# Patient Record
Sex: Female | Born: 1955 | Race: White | Hispanic: No | Smoking: Former smoker
Health system: Southern US, Community
[De-identification: ages and names within clinical notes are randomized; demographics above are authoritative.]

## PROBLEM LIST (undated history)

## (undated) DIAGNOSIS — J449 Chronic obstructive pulmonary disease, unspecified: Secondary | ICD-10-CM

## (undated) DIAGNOSIS — J45909 Unspecified asthma, uncomplicated: Secondary | ICD-10-CM

## (undated) DIAGNOSIS — E119 Type 2 diabetes mellitus without complications: Secondary | ICD-10-CM

## (undated) DIAGNOSIS — G4730 Sleep apnea, unspecified: Secondary | ICD-10-CM

## (undated) HISTORY — DX: Unspecified asthma, uncomplicated: J45.909

## (undated) HISTORY — DX: Chronic obstructive pulmonary disease, unspecified: J44.9

## (undated) HISTORY — DX: Type 2 diabetes mellitus without complications: E11.9

## (undated) HISTORY — PX: TUBAL LIGATION: SHX77

## (undated) HISTORY — PX: BACK SURGERY: SHX140

## (undated) HISTORY — PX: TONSILLECTOMY AND ADENOIDECTOMY: SHX28

## (undated) HISTORY — PX: CYSTOSTOMY W/ BLADDER BIOPSY: SHX1431

## (undated) HISTORY — PX: DILATION AND EVACUATION: SHX1459

## (undated) HISTORY — DX: Sleep apnea, unspecified: G47.30

---

## 2007-03-05 ENCOUNTER — Encounter: Admission: RE | Admit: 2007-03-05 | Discharge: 2007-03-05 | Payer: Self-pay | Admitting: Obstetrics & Gynecology

## 2008-03-17 ENCOUNTER — Encounter: Admission: RE | Admit: 2008-03-17 | Discharge: 2008-03-17 | Payer: Self-pay | Admitting: Obstetrics & Gynecology

## 2009-03-30 ENCOUNTER — Encounter: Admission: RE | Admit: 2009-03-30 | Discharge: 2009-03-30 | Payer: Self-pay | Admitting: Obstetrics & Gynecology

## 2010-04-26 ENCOUNTER — Encounter: Admission: RE | Admit: 2010-04-26 | Payer: Self-pay | Admitting: Obstetrics & Gynecology

## 2010-05-10 ENCOUNTER — Encounter: Admission: RE | Admit: 2010-05-10 | Discharge: 2010-05-10 | Payer: Self-pay | Admitting: Obstetrics & Gynecology

## 2010-08-01 ENCOUNTER — Encounter: Payer: Self-pay | Admitting: Obstetrics & Gynecology

## 2011-05-25 ENCOUNTER — Other Ambulatory Visit: Payer: Self-pay | Admitting: Obstetrics & Gynecology

## 2011-05-25 DIAGNOSIS — Z1231 Encounter for screening mammogram for malignant neoplasm of breast: Secondary | ICD-10-CM

## 2011-06-06 ENCOUNTER — Ambulatory Visit
Admission: RE | Admit: 2011-06-06 | Discharge: 2011-06-06 | Disposition: A | Discharge status: Home / Self Care | Payer: Self-pay | Source: Ambulatory Visit | Attending: Obstetrics & Gynecology | Admitting: Obstetrics & Gynecology

## 2011-06-06 DIAGNOSIS — Z1231 Encounter for screening mammogram for malignant neoplasm of breast: Secondary | ICD-10-CM

## 2012-06-03 ENCOUNTER — Other Ambulatory Visit: Payer: Self-pay | Admitting: Obstetrics & Gynecology

## 2012-06-03 ENCOUNTER — Ambulatory Visit: Payer: Federal, State, Local not specified - PPO

## 2012-06-03 DIAGNOSIS — Z1231 Encounter for screening mammogram for malignant neoplasm of breast: Secondary | ICD-10-CM

## 2012-06-11 ENCOUNTER — Ambulatory Visit: Payer: Federal, State, Local not specified - PPO

## 2012-06-18 ENCOUNTER — Ambulatory Visit (INDEPENDENT_AMBULATORY_CARE_PROVIDER_SITE_OTHER): Payer: Federal, State, Local not specified - PPO

## 2012-06-18 DIAGNOSIS — Z1231 Encounter for screening mammogram for malignant neoplasm of breast: Secondary | ICD-10-CM

## 2013-06-09 ENCOUNTER — Other Ambulatory Visit: Payer: Self-pay | Admitting: Obstetrics & Gynecology

## 2013-06-09 DIAGNOSIS — Z1231 Encounter for screening mammogram for malignant neoplasm of breast: Secondary | ICD-10-CM

## 2013-06-12 ENCOUNTER — Ambulatory Visit: Payer: Federal, State, Local not specified - PPO

## 2013-06-19 ENCOUNTER — Ambulatory Visit: Payer: Federal, State, Local not specified - PPO

## 2013-07-01 ENCOUNTER — Ambulatory Visit (INDEPENDENT_AMBULATORY_CARE_PROVIDER_SITE_OTHER): Payer: Federal, State, Local not specified - PPO

## 2013-07-01 DIAGNOSIS — Z1231 Encounter for screening mammogram for malignant neoplasm of breast: Secondary | ICD-10-CM

## 2014-07-01 ENCOUNTER — Other Ambulatory Visit: Payer: Self-pay | Admitting: Obstetrics & Gynecology

## 2014-07-01 DIAGNOSIS — Z1231 Encounter for screening mammogram for malignant neoplasm of breast: Secondary | ICD-10-CM

## 2014-07-08 ENCOUNTER — Ambulatory Visit (INDEPENDENT_AMBULATORY_CARE_PROVIDER_SITE_OTHER): Payer: Federal, State, Local not specified - PPO

## 2014-07-08 DIAGNOSIS — Z1231 Encounter for screening mammogram for malignant neoplasm of breast: Secondary | ICD-10-CM

## 2015-06-14 ENCOUNTER — Other Ambulatory Visit: Payer: Self-pay | Admitting: Obstetrics & Gynecology

## 2015-06-14 DIAGNOSIS — Z139 Encounter for screening, unspecified: Secondary | ICD-10-CM

## 2015-07-14 ENCOUNTER — Ambulatory Visit (INDEPENDENT_AMBULATORY_CARE_PROVIDER_SITE_OTHER): Payer: Federal, State, Local not specified - PPO

## 2015-07-14 DIAGNOSIS — Z1231 Encounter for screening mammogram for malignant neoplasm of breast: Secondary | ICD-10-CM | POA: Diagnosis not present

## 2015-07-14 DIAGNOSIS — Z139 Encounter for screening, unspecified: Secondary | ICD-10-CM

## 2017-01-19 ENCOUNTER — Other Ambulatory Visit: Payer: Self-pay | Admitting: Obstetrics & Gynecology

## 2017-01-19 DIAGNOSIS — Z1231 Encounter for screening mammogram for malignant neoplasm of breast: Secondary | ICD-10-CM

## 2017-01-30 ENCOUNTER — Ambulatory Visit (INDEPENDENT_AMBULATORY_CARE_PROVIDER_SITE_OTHER): Payer: Federal, State, Local not specified - PPO

## 2017-01-30 DIAGNOSIS — Z1231 Encounter for screening mammogram for malignant neoplasm of breast: Secondary | ICD-10-CM | POA: Diagnosis not present

## 2017-02-15 ENCOUNTER — Encounter: Payer: Self-pay | Admitting: Obstetrics & Gynecology

## 2017-04-19 ENCOUNTER — Encounter: Payer: Self-pay | Admitting: Obstetrics & Gynecology

## 2017-04-19 ENCOUNTER — Ambulatory Visit (INDEPENDENT_AMBULATORY_CARE_PROVIDER_SITE_OTHER): Payer: Federal, State, Local not specified - PPO | Admitting: Obstetrics & Gynecology

## 2017-04-19 VITALS — BP 132/86 | Ht 67.0 in | Wt 212.0 lb

## 2017-04-19 DIAGNOSIS — Z01419 Encounter for gynecological examination (general) (routine) without abnormal findings: Secondary | ICD-10-CM

## 2017-04-19 DIAGNOSIS — Z780 Asymptomatic menopausal state: Secondary | ICD-10-CM

## 2017-04-19 DIAGNOSIS — Z720 Tobacco use: Secondary | ICD-10-CM | POA: Diagnosis not present

## 2017-04-19 DIAGNOSIS — Z23 Encounter for immunization: Secondary | ICD-10-CM

## 2017-04-19 NOTE — Addendum Note (Signed)
Addended by: Berna Spare A on: 04/19/2017 12:33 PM   Modules accepted: Orders

## 2017-04-19 NOTE — Addendum Note (Signed)
Addended by: Berna Spare A on: 04/19/2017 12:35 PM   Modules accepted: Orders

## 2017-04-19 NOTE — Patient Instructions (Signed)
1. Encounter for routine gynecological examination with Papanicolaou smear of cervix Normal gyn exam.  Pap reflex done.  Breasts wnl.  Mammo 01/2017 neg.  2. Menopause present No HRT.  No PMB.  Vit D supplement/Ca++ in nutrition/Weight bearing physical activity.  Schedule Bone Density here asap. - DG Bone Density; Future  3. Tobacco abuse Strongly recommend smoking cessation.  CXR with Fam MD. - DG Bone Density; Future  Marissa White, it was a pleasure to see you today!  I will inform you of your results as soon as available.   How to Increase Your Level of Physical Activity Getting regular physical activity is important for your overall health and well-being. Most people do not get enough exercise. There are easy ways to increase your level of physical activity, even if you have not been very active in the past or you are just starting out. Why is physical activity important? Physical activity has many short-term and long-term health benefits. Regular exercise can:  Help you lose weight or maintain a healthy weight.  Strengthen your muscles and bones.  Boost your mood and improve self-esteem.  Reduce your risk of certain long-term (chronic) diseases, like heart disease, cancer, and diabetes.  Help you stay capable of walking and moving around (mobile) as you age.  Prevent accidents, such as falls, as you age.  Increase life expectancy.  What are the benefits of being physically active on a regular basis? In addition to improving your physical health, being physically active on most days of the week can help you in ways that you may not expect. Benefits of regular physical activity may include:  Feeling good about your body.  Being able to move around more easily and for longer periods of time without getting tired (increased stamina).  Finding new sources of fun and enjoyment.  Meeting new people who share a common interest.  Being able to fight off illness better (enhanced  immunity).  Being able to sleep better.  What can happen if I am not physically active on a regular basis? Not getting enough physical activity can lead to an unhealthy lifestyle and future health problems. This can increase your chances of:  Becoming overweight or obese.  Becoming sick.  Developing chronic illnesses, like heart disease or diabetes.  Having mental health problems, like depression or anxiety.  Having sleep problems.  Having trouble walking or getting yourself around (reduced mobility).  Injuring yourself in a fall as you get older.  What steps can I take to be more physically active?  Check with your health care provider about how to get started. Ask your health care provider what activities are safe for you.  Start out slowly. Walking or doing some simple chair exercises is a good place to start, especially if you have not been active before or for a long time.  Try to find activities that you enjoy. You are more likely to commit to an exercise routine if it does not feel like a chore.  If you have bone or joint problems, choose low-impact exercises, like walking or swimming.  Include physical activity in your everyday routine.  Invite friends or family members to exercise with you. This also will help you commit to your workout plan.  Set goals that you can work toward.  Aim for at least 150 minutes of moderate-intensity exercise each week. Examples of moderate-intensity exercise include walking or riding a bike. Where to find more information:  Centers for Disease Control and Prevention: JokeRule.co.uk  [employer] on [employer] www.http://smith-thompson.com/  ChooseMyPlate: MissedFlights.com.br Contact a health care provider if:  You have headaches, muscle aches, or joint pain.  You feel dizzy or light-headed while exercising.  You faint.  You have chest pain while  exercising. Summary  Exercise benefits your mind and body at any age, even if you are just starting out.  If you have a chronic illness or have not been active for a while, check with your health care provider before increasing your physical activity.  Choose activities that are safe and enjoyable for you.Ask your health care provider what activities are safe for you.  Start slowly. Tell your health care provider if you have problems as you start to increase your activity level. This information is not intended to replace advice given to you by your health care provider. Make sure you discuss any questions you have with your health care provider. Document Released: 06/15/2016 Document Revised: 06/15/2016 Document Reviewed: 06/15/2016 Elsevier Interactive Patient Education  2018 [employer].  Health Risks of Smoking Smoking cigarettes is very bad for your health. Tobacco smoke has over 200 known poisons in it. It contains the poisonous gases nitrogen oxide and carbon monoxide. There are over 60 chemicals in tobacco smoke that cause cancer. Smoking is difficult to quit because a chemical in tobacco, called nicotine, causes addiction or dependence. When you smoke and inhale, nicotine is absorbed rapidly into the bloodstream through your lungs. Both inhaled and non-inhaled nicotine may be addictive. What are the risks of cigarette smoke? Cigarette smokers have an increased risk of many serious medical problems, including:  Lung cancer.  Lung disease, such as pneumonia, bronchitis, and emphysema.  Chest pain (angina) and heart attack because the heart is not getting enough oxygen.  Heart disease and peripheral blood vessel disease.  High blood pressure (hypertension).  Stroke.  Oral cancer, including cancer of the lip, mouth, or voice box.  Bladder cancer.  Pancreatic cancer.  Cervical cancer.  Pregnancy complications, including premature birth.  Stillbirths and smaller  newborn babies, birth defects, and genetic damage to sperm.  Early menopause.  Lower estrogen level for women.  Infertility.  Facial wrinkles.  Blindness.  Increased risk of broken bones (fractures).  Senile dementia.  Stomach ulcers and internal bleeding.  Delayed wound healing and increased risk of complications during surgery.  Even smoking lightly shortens your life expectancy by several years.  Because of secondhand smoke exposure, children of smokers have an increased risk of the following:  Sudden infant death syndrome (SIDS).  Respiratory infections.  Lung cancer.  Heart disease.  Ear infections.  What are the benefits of quitting? There are many health benefits of quitting smoking. Here are some of them:  Within days of quitting smoking, your risk of having a heart attack decreases, your blood flow improves, and your lung capacity improves. Blood pressure, pulse rate, and breathing patterns start returning to normal soon after quitting.  Within months, your lungs may clear up completely.  Quitting for 10 years reduces your risk of developing lung cancer and heart disease to almost that of a nonsmoker.  People who quit may see an improvement in their overall quality of life.  How do I quit smoking? Smoking is an addiction with both physical and psychological effects, and longtime habits can be hard to change. Your health care provider can recommend:  Programs and community resources, which may include group support, education, or talk therapy.  Prescription medicines to help reduce cravings.  Nicotine replacement products, such as patches, gum, and nasal sprays. Use these products only as directed. Do not replace cigarette smoking with electronic cigarettes, which are commonly called e-cigarettes. The safety of e-cigarettes is not known, and some may contain harmful chemicals.  A combination of two or more of these methods.  Where to find more  information:  American Lung Association: www.lung.org  American Cancer Society: www.cancer.org Summary  Smoking cigarettes is very bad for your health. Cigarette smokers have an increased risk of many serious medical problems, including several cancers, heart disease, and stroke.  Smoking is an addiction with both physical and psychological effects, and longtime habits can be hard to change.  By stopping right away, you can greatly reduce the risk of medical problems for you and your family.  To help you quit smoking, your health care provider can recommend programs, community resources, prescription medicines, and nicotine replacement products such as patches, gum, and nasal sprays. This information is not intended to replace advice given to you by your health care provider. Make sure you discuss any questions you have with your health care provider. Document Released: 08/03/2004 Document Revised: 06/30/2016 Document Reviewed: 06/30/2016 Elsevier Interactive Patient Education  2017 [employer].

## 2017-04-19 NOTE — Progress Notes (Signed)
Marissa White 05-21-1956 [id number]   History:    61 y.o. G5P1A4L1 Boyfriend  RP:  Established patient presenting for annual gyn exam   HPI:  Menopause.  No HRT.  No PMB.  No pelvic pain.  Normal vaginal secretions.  Rarely sexually active, using condoms each time.  Breasts wnl.  Occasionally relapses with Cigarette smoking.  Strongly recommend cessation of smoking.  Recommend CXR with Fam MD.  Mictions/BMs wnl.  Past medical history,surgical history, family history and social history were all reviewed and documented in the EPIC chart.  Gynecologic History No LMP recorded. Patient is postmenopausal. Contraception: post menopausal status Last Pap: 01/2016. Results were: Neg/HPV HR neg Last mammogram: 01/30/2017. Results were: Negative [facility] 2014, 5 yr schedule No recent Bone Density, will schedule here  Obstetric History OB History  Gravida Para Term Preterm AB Living  SAB TAB Ectopic Multiple Live Births  3            # Outcome Date GA Lbr Len/2nd Weight Sex Delivery Anes PTL Lv  5 Gravida           4 SAB           3 SAB           2 SAB           1 Para                ROS: A ROS was performed and pertinent positives and negatives are included in the history.  GENERAL: No fevers or chills. HEENT: No change in vision, no earache, sore throat or sinus congestion. NECK: No pain or stiffness. CARDIOVASCULAR: No chest pain or pressure. No palpitations. PULMONARY: No shortness of breath, cough or wheeze. GASTROINTESTINAL: No abdominal pain, nausea, vomiting or diarrhea, melena or bright red blood per rectum. GENITOURINARY: No urinary frequency, urgency, hesitancy or dysuria. MUSCULOSKELETAL: No joint or muscle pain, no back pain, no recent trauma. DERMATOLOGIC: No rash, no itching, no lesions. ENDOCRINE: No polyuria, polydipsia, no heat or cold intolerance. No recent change in weight. HEMATOLOGICAL: No anemia or easy bruising or bleeding. NEUROLOGIC: No headache,  seizures, numbness, tingling or weakness. PSYCHIATRIC: No depression, no loss of interest in normal activity or change in sleep pattern.     Exam:   BP 132/86   Ht  (1.702 m)   Wt 212 lb (96.2 kg)   BMI 33.20 kg/m   Body mass index is 33.2 kg/m.  General appearance : Well developed well nourished female. No acute distress HEENT: Eyes: no retinal hemorrhage or exudates,  Neck supple, trachea midline, no carotid bruits, no thyroidmegaly Lungs: Clear to auscultation, no rhonchi or wheezes, or rib retractions  Heart: Regular rate and rhythm, no murmurs or gallops Breast:Examined in sitting and supine position were symmetrical in appearance, no palpable masses or tenderness,  no skin retraction, no nipple inversion, no nipple discharge, no skin discoloration, no axillary or supraclavicular lymphadenopathy Abdomen: no palpable masses or tenderness, no rebound or guarding Extremities: no edema or skin discoloration or tenderness  Pelvic: Vulva normal  Bartholin, Urethra, Skene Glands: Within normal limits             Vagina: No gross lesions or discharge  Cervix: No gross lesions or discharge.  Pap reflex done.  Uterus  AV, normal size, shape and consistency, non-tender and mobile  Adnexa  Without masses or tenderness  Anus and perineum  normal  Assessment/Plan:  61 y.o. female for annual exam   1. Encounter for routine gynecological examination with Papanicolaou smear of cervix Normal gyn exam.  Pap reflex done.  Breasts wnl.  Mammo 01/2017 neg.  2. Menopause present No HRT.  No PMB.  Vit D supplement/Ca++ in nutrition/Weight bearing physical activity.  Schedule Bone Density here asap. - DG Bone Density; Future  3. Tobacco abuse Strongly recommend smoking cessation.  CXR with Fam MD. - DG Bone Density; Future  Genia Del MD, 11:38 AM 04/19/2017

## 2017-04-23 LAB — PAP IG W/ RFLX HPV ASCU

## 2017-05-03 ENCOUNTER — Other Ambulatory Visit: Payer: Self-pay | Admitting: Gynecology

## 2017-05-03 DIAGNOSIS — Z13820 Encounter for screening for osteoporosis: Secondary | ICD-10-CM

## 2017-05-03 DIAGNOSIS — Z780 Asymptomatic menopausal state: Secondary | ICD-10-CM

## 2017-05-15 ENCOUNTER — Encounter (INDEPENDENT_AMBULATORY_CARE_PROVIDER_SITE_OTHER): Payer: Self-pay

## 2017-05-15 ENCOUNTER — Ambulatory Visit (INDEPENDENT_AMBULATORY_CARE_PROVIDER_SITE_OTHER): Payer: Federal, State, Local not specified - PPO

## 2017-05-15 ENCOUNTER — Other Ambulatory Visit: Payer: Self-pay | Admitting: Gynecology

## 2017-05-15 DIAGNOSIS — Z13820 Encounter for screening for osteoporosis: Secondary | ICD-10-CM

## 2017-05-15 DIAGNOSIS — M85852 Other specified disorders of bone density and structure, left thigh: Secondary | ICD-10-CM

## 2017-05-15 DIAGNOSIS — M8588 Other specified disorders of bone density and structure, other site: Secondary | ICD-10-CM | POA: Diagnosis not present

## 2017-05-15 DIAGNOSIS — Z780 Asymptomatic menopausal state: Secondary | ICD-10-CM | POA: Diagnosis not present

## 2017-05-16 ENCOUNTER — Encounter: Payer: Self-pay | Admitting: Gynecology

## 2018-04-10 ENCOUNTER — Other Ambulatory Visit: Payer: Self-pay | Admitting: Obstetrics & Gynecology

## 2018-04-10 DIAGNOSIS — Z1231 Encounter for screening mammogram for malignant neoplasm of breast: Secondary | ICD-10-CM

## 2018-04-17 ENCOUNTER — Ambulatory Visit: Payer: Federal, State, Local not specified - PPO

## 2018-07-11 ENCOUNTER — Encounter: Payer: Self-pay | Admitting: Obstetrics & Gynecology

## 2018-07-11 ENCOUNTER — Ambulatory Visit (INDEPENDENT_AMBULATORY_CARE_PROVIDER_SITE_OTHER): Payer: Federal, State, Local not specified - PPO | Admitting: Obstetrics & Gynecology

## 2018-07-11 VITALS — BP 110/80 | Ht 67.0 in | Wt 215.0 lb

## 2018-07-11 DIAGNOSIS — Z780 Asymptomatic menopausal state: Secondary | ICD-10-CM | POA: Diagnosis not present

## 2018-07-11 DIAGNOSIS — Z6833 Body mass index (BMI) 33.0-33.9, adult: Secondary | ICD-10-CM

## 2018-07-11 DIAGNOSIS — Z01419 Encounter for gynecological examination (general) (routine) without abnormal findings: Secondary | ICD-10-CM

## 2018-07-11 DIAGNOSIS — E6609 Other obesity due to excess calories: Secondary | ICD-10-CM

## 2018-07-11 NOTE — Patient Instructions (Signed)
1. Encounter for routine gynecological examination with Papanicolaou smear of cervix Normal gynecologic exam in menopause.  Pap reflex done.  Breast exam normal.  Will schedule screening mammogram at the breast center now.  Health labs with family physician.  Colonoscopy done in 2019.  2. Postmenopausal Well on no hormone replacement therapy.  No postmenopausal bleeding.  Recommend vitamin D supplements, calcium intake of 1.5 g/day including nutritional and supplemental.  Regular weightbearing physical activity recommended.  Last bone density November 2018 showed osteopenia with a T score of -1.3.  We will repeat a bone density in November 2020.  3. Class 1 obesity due to excess calories without serious comorbidity with body mass index (BMI) of 33.0 to 33.9 in adult Recommend a lower calorie/carb diet such as [employer].  Aerobic physical activities recommended 5 times a week and weightlifting every 2 days.  [place], it was a pleasure seeing you today!  I will inform you of your results as soon as they are available.

## 2018-07-11 NOTE — Progress Notes (Signed)
Marissa White 11-15-1955 [id number]   History:    63 y.o. G5P1A4L1 Single.  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  No PMB.  No Pelvic pain.  Has been sexually active since last year's Pap test but no current sexual activity.  Normal vaginal secretions.  Urine and bowel movements normal currently.  Breasts normal.  Body mass index 33.67.  Intends to exercise more.  We will also start a lower calorie diet to lose weight.  Health labs with family physician.  Colonoscopy in 2019.  Past medical history,surgical history, family history and social history were all reviewed and documented in the EPIC chart.  Gynecologic History No LMP recorded. Patient is postmenopausal. Contraception: condoms/Postmenopausal Last Pap: 04/2017.  Results were: Negative Last mammogram: 01/2017. Results were: Negative Bone Density: 05/2017 Osteopenia T-Score -1.3 Colonoscopy: 2019  Obstetric History OB History  Gravida Para Term Preterm AB Living  5 1     3 1   SAB TAB Ectopic Multiple Live Births  3            # Outcome Date GA Lbr Len/2nd Weight Sex Delivery Anes PTL Lv  5 Gravida           4 SAB           3 SAB           2 SAB           1 Para              ROS: A ROS was performed and pertinent positives and negatives are included in the history.  GENERAL: No fevers or chills. HEENT: No change in vision, no earache, sore throat or sinus congestion. NECK: No pain or stiffness. CARDIOVASCULAR: No chest pain or pressure. No palpitations. PULMONARY: No shortness of breath, cough or wheeze. GASTROINTESTINAL: No abdominal pain, nausea, vomiting or diarrhea, melena or bright red blood per rectum. GENITOURINARY: No urinary frequency, urgency, hesitancy or dysuria. MUSCULOSKELETAL: No joint or muscle pain, no back pain, no recent trauma. DERMATOLOGIC: No rash, no itching, no lesions. ENDOCRINE: No polyuria, polydipsia, no heat  or cold intolerance. No recent change in weight. HEMATOLOGICAL: No anemia or easy bruising or bleeding. NEUROLOGIC: No headache, seizures, numbness, tingling or weakness. PSYCHIATRIC: No depression, no loss of interest in normal activity or change in sleep pattern.     Exam:   BP 110/80 (BP Location: Right Arm, Patient Position: Sitting, Cuff Size: Large)   Ht 5\' 7"  (1.702 m)   Wt 215 lb (97.5 kg)   BMI 33.67 kg/m   Body mass index is 33.67 kg/m.  General appearance : Well developed well nourished female. No acute distress HEENT: Eyes: no retinal hemorrhage or exudates,  Neck supple, trachea midline, no carotid bruits, no thyroidmegaly Lungs: Clear to auscultation, no rhonchi or wheezes, or rib retractions  Heart: Regular rate and rhythm, no murmurs or gallops Breast:Examined in sitting and supine position were symmetrical in appearance, no palpable masses or tenderness,  no skin retraction, no nipple inversion, no nipple discharge, no skin discoloration, no axillary or supraclavicular lymphadenopathy Abdomen: no palpable masses or tenderness, no rebound or guarding Extremities: no edema or skin discoloration or tenderness  Pelvic: Vulva: Normal             Vagina: No gross lesions or discharge  Cervix: No gross lesions or discharge.  Pap reflex done  Uterus  AV, normal size, shape and consistency, non-tender and mobile  Adnexa  Without masses or tenderness  Anus: Normal   Assessment/Plan:  63 y.o. female for annual exam   1. Encounter for routine gynecological examination with Papanicolaou smear of cervix Normal gynecologic exam in menopause.  Pap reflex done.  Breast exam normal.  Will schedule screening mammogram at the breast center now.  Health labs with family physician.  Colonoscopy done in 2019.  2. Postmenopausal Well on no hormone replacement therapy.  No postmenopausal bleeding.  Recommend vitamin D supplements, calcium intake of 1.5 g/day including nutritional and  supplemental.  Regular weightbearing physical activity recommended.  Last bone density November 2018 showed osteopenia with a T score of -1.3.  We will repeat a bone density in November 2020.  3. Class 1 obesity due to excess calories without serious comorbidity with body mass index (BMI) of 33.0 to 33.9 in adult Recommend a lower calorie/carb diet such as [employer].  Aerobic physical activities recommended 5 times a week and weightlifting every 2 days.  Genia Del MD, 9:08 AM 07/11/2018

## 2018-07-12 LAB — PAP IG W/ RFLX HPV ASCU

## 2018-08-01 ENCOUNTER — Ambulatory Visit (INDEPENDENT_AMBULATORY_CARE_PROVIDER_SITE_OTHER): Payer: Federal, State, Local not specified - PPO

## 2018-08-01 DIAGNOSIS — Z1231 Encounter for screening mammogram for malignant neoplasm of breast: Secondary | ICD-10-CM

## 2019-04-08 ENCOUNTER — Encounter: Payer: Self-pay | Admitting: Gynecology

## 2019-07-17 ENCOUNTER — Encounter: Payer: Federal, State, Local not specified - PPO | Admitting: Obstetrics & Gynecology

## 2019-07-24 ENCOUNTER — Encounter: Payer: Federal, State, Local not specified - PPO | Admitting: Obstetrics & Gynecology

## 2019-10-07 ENCOUNTER — Other Ambulatory Visit: Payer: Self-pay

## 2019-10-08 ENCOUNTER — Encounter: Payer: Self-pay | Admitting: Obstetrics & Gynecology

## 2019-10-08 ENCOUNTER — Ambulatory Visit (INDEPENDENT_AMBULATORY_CARE_PROVIDER_SITE_OTHER): Payer: Federal, State, Local not specified - PPO | Admitting: Obstetrics & Gynecology

## 2019-10-08 ENCOUNTER — Other Ambulatory Visit: Payer: Self-pay | Admitting: Obstetrics & Gynecology

## 2019-10-08 VITALS — BP 136/88 | Ht 66.0 in | Wt 197.0 lb

## 2019-10-08 DIAGNOSIS — E6609 Other obesity due to excess calories: Secondary | ICD-10-CM

## 2019-10-08 DIAGNOSIS — Z780 Asymptomatic menopausal state: Secondary | ICD-10-CM | POA: Diagnosis not present

## 2019-10-08 DIAGNOSIS — M85852 Other specified disorders of bone density and structure, left thigh: Secondary | ICD-10-CM

## 2019-10-08 DIAGNOSIS — M8588 Other specified disorders of bone density and structure, other site: Secondary | ICD-10-CM

## 2019-10-08 DIAGNOSIS — Z01419 Encounter for gynecological examination (general) (routine) without abnormal findings: Secondary | ICD-10-CM | POA: Diagnosis not present

## 2019-10-08 DIAGNOSIS — Z1231 Encounter for screening mammogram for malignant neoplasm of breast: Secondary | ICD-10-CM

## 2019-10-08 DIAGNOSIS — Z6831 Body mass index (BMI) 31.0-31.9, adult: Secondary | ICD-10-CM

## 2019-10-08 NOTE — Patient Instructions (Signed)
1. Well female exam with routine gynecological exam Normal gynecologic exam in menopause.  No indication to repeat a Pap test this year.  Breast exam normal.  Will schedule a screening mammogram now.  Colonoscopy 2019.  Health labs with family physician.  2. Postmenopausal Well on no hormone replacement therapy.  No postmenopausal bleeding.  3. Osteopenia of neck of left femur Osteopenia at the left femoral neck with a T score of -1.3 in November 2018.  We will repeat a bone density now.  Vitamin D supplements, calcium intake of 1200 mg daily and regular weightbearing physical activity is recommended. - DG Bone Density; Future  4. Class 1 obesity due to excess calories without serious comorbidity with body mass index (BMI) of 31.0 to 31.9 in adult Successful weight loss since last year.  We will continue on the lower calorie/carb diet and aerobic activities 5 times a week with light weightlifting every 2 days.  Marissa White, it was a pleasure seeing you today!

## 2019-10-08 NOTE — Progress Notes (Signed)
Marissa White August 08, 1955 [id number]   History:    64 y.o. [id number] Single.  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  No PMB.  No Pelvic pain.  Abstinent currently.  Normal vaginal secretions.  Urine and bowel movements normal currently.  Breasts normal.  Body mass index improved to 31.8. On a low Calorie/Carb diet.  Exercising more regularly.  Health labs with family physician.  Colonoscopy in 2019.   Past medical history,surgical history, family history and social history were all reviewed and documented in the EPIC chart.  Gynecologic History No LMP recorded. Patient is postmenopausal.  Obstetric History OB History  Gravida Para Term Preterm AB Living  5 1     3 1   SAB TAB Ectopic Multiple Live Births  3            # Outcome Date GA Lbr Len/2nd Weight Sex Delivery Anes PTL Lv  5 Gravida           4 SAB           3 SAB           2 SAB           1 Para              ROS: A ROS was performed and pertinent positives and negatives are included in the history.  GENERAL: No fevers or chills. HEENT: No change in vision, no earache, sore throat or sinus congestion. NECK: No pain or stiffness. CARDIOVASCULAR: No chest pain or pressure. No palpitations. PULMONARY: No shortness of breath, cough or wheeze. GASTROINTESTINAL: No abdominal pain, nausea, vomiting or diarrhea, melena or bright red blood per rectum. GENITOURINARY: No urinary frequency, urgency, hesitancy or dysuria. MUSCULOSKELETAL: No joint or muscle pain, no back pain, no recent trauma. DERMATOLOGIC: No rash, no itching, no lesions. ENDOCRINE: No polyuria, polydipsia, no heat or cold intolerance. No recent change in weight. HEMATOLOGICAL: No anemia or easy bruising or bleeding. NEUROLOGIC: No headache, seizures, numbness, tingling or weakness. PSYCHIATRIC: No depression, no loss of interest in normal  activity or change in sleep pattern.     Exam:   BP 136/88   Ht 5\' 6"  (1.676 m)   Wt 197 lb (89.4 kg)   BMI 31.80 kg/m   Body mass index is 31.8 kg/m.  General appearance : Well developed well nourished female. No acute distress HEENT: Eyes: no retinal hemorrhage or exudates,  Neck supple, trachea midline, no carotid bruits, no thyroidmegaly Lungs: Clear to auscultation, no rhonchi or wheezes, or rib retractions  Heart: Regular rate and rhythm, no murmurs or gallops Breast:Examined in sitting and supine position were symmetrical in appearance, no palpable masses or tenderness,  no skin retraction, no nipple inversion, no nipple discharge, no skin discoloration, no axillary or supraclavicular lymphadenopathy Abdomen: no palpable masses or tenderness, no rebound or guarding Extremities: no edema or skin discoloration or tenderness  Pelvic: Vulva: Normal             Vagina: No gross lesions or discharge  Cervix: No gross lesions or discharge  Uterus  AV, normal size, shape and consistency, non-tender and mobile  Adnexa  Without masses or tenderness  Anus: Normal   Assessment/Plan:  64 y.o. female for annual exam   1. Well female exam with routine gynecological exam Normal gynecologic exam in menopause.  No  indication to repeat a Pap test this year.  Breast exam normal.  Will schedule a screening mammogram now.  Colonoscopy 2019.  Health labs with family physician.  2. Postmenopausal Well on no hormone replacement therapy.  No postmenopausal bleeding.  3. Osteopenia of neck of left femur Osteopenia at the left femoral neck with a T score of -1.3 in November 2018.  We will repeat a bone density now.  Vitamin D supplements, calcium intake of 1200 mg daily and regular weightbearing physical activity is recommended. - DG Bone Density; Future  4. Class 1 obesity due to excess calories without serious comorbidity with body mass index (BMI) of 31.0 to 31.9 in adult Successful weight  loss since last year.  We will continue on the lower calorie/carb diet and aerobic activities 5 times a week with light weightlifting every 2 days.  Princess Bruins MD, 10:46 AM 10/08/2019

## 2019-10-22 ENCOUNTER — Ambulatory Visit (INDEPENDENT_AMBULATORY_CARE_PROVIDER_SITE_OTHER): Payer: Federal, State, Local not specified - PPO

## 2019-10-22 ENCOUNTER — Other Ambulatory Visit: Payer: Self-pay

## 2019-10-22 DIAGNOSIS — Z1231 Encounter for screening mammogram for malignant neoplasm of breast: Secondary | ICD-10-CM

## 2019-10-22 DIAGNOSIS — M85852 Other specified disorders of bone density and structure, left thigh: Secondary | ICD-10-CM | POA: Diagnosis not present

## 2019-11-19 NOTE — Telephone Encounter (Signed)
I spoke with patient and read her this unread My Chart email with bone density test result.

## 2020-10-08 ENCOUNTER — Encounter: Payer: Self-pay | Admitting: Obstetrics & Gynecology

## 2020-10-08 ENCOUNTER — Other Ambulatory Visit: Payer: Self-pay

## 2020-10-08 ENCOUNTER — Ambulatory Visit (INDEPENDENT_AMBULATORY_CARE_PROVIDER_SITE_OTHER): Payer: Federal, State, Local not specified - PPO | Admitting: Obstetrics & Gynecology

## 2020-10-08 VITALS — BP 136/84 | Ht 66.0 in | Wt 217.0 lb

## 2020-10-08 DIAGNOSIS — M85852 Other specified disorders of bone density and structure, left thigh: Secondary | ICD-10-CM

## 2020-10-08 DIAGNOSIS — Z780 Asymptomatic menopausal state: Secondary | ICD-10-CM | POA: Diagnosis not present

## 2020-10-08 DIAGNOSIS — Z6835 Body mass index (BMI) 35.0-35.9, adult: Secondary | ICD-10-CM

## 2020-10-08 DIAGNOSIS — Z01419 Encounter for gynecological examination (general) (routine) without abnormal findings: Secondary | ICD-10-CM | POA: Diagnosis not present

## 2020-10-08 DIAGNOSIS — E6609 Other obesity due to excess calories: Secondary | ICD-10-CM

## 2020-10-08 NOTE — Progress Notes (Signed)
Marissa White 1956-06-20 [id number]   History:    65 y.o. 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  Daughter Boneta Lucks (my patient as well)  EX:[id number] presenting for annual gyn exam   UYQ:[id number], well on no HRT. No PMB. No Pelvic pain. Abstinent currently. Normal vaginal secretions. Urine and bowel movements normal currently. Breasts normal. Body mass index increased to 35.02. Health labs with family physician. Colonoscopy in 2019.  Past medical history,surgical history, family history and social history were all reviewed and documented in the EPIC chart.  Gynecologic History No LMP recorded. Patient is postmenopausal.  Obstetric History OB History  Gravida Para Term Preterm AB Living  5 1     3 1   SAB IAB Ectopic Multiple Live Births  3            # Outcome Date GA Lbr Len/2nd Weight Sex Delivery Anes PTL Lv  5 Gravida           4 SAB           3 SAB           2 SAB           1 Para              ROS: A ROS was performed and pertinent positives and negatives are included in the history.  GENERAL: No fevers or chills. HEENT: No change in vision, no earache, sore throat or sinus congestion. NECK: No pain or stiffness. CARDIOVASCULAR: No chest pain or pressure. No palpitations. PULMONARY: No shortness of breath, cough or wheeze. GASTROINTESTINAL: No abdominal pain, nausea, vomiting or diarrhea, melena or bright red blood per rectum. GENITOURINARY: No urinary frequency, urgency, hesitancy or dysuria. MUSCULOSKELETAL: No joint or muscle pain, no back pain, no recent trauma. DERMATOLOGIC: No rash, no itching, no lesions. ENDOCRINE: No polyuria, polydipsia, no heat or cold intolerance. No recent change in weight. HEMATOLOGICAL: No anemia or easy bruising or bleeding. NEUROLOGIC: No headache, seizures, numbness, tingling or weakness. PSYCHIATRIC: No depression, no loss of interest in normal activity or change in sleep pattern.      Exam:   BP 136/84   Ht 5\' 6"  (1.676 m)   Wt 217 lb (98.4 kg)   BMI 35.02 kg/m   Body mass index is 35.02 kg/m.  General appearance : Well developed well nourished female. No acute distress HEENT: Eyes: no retinal hemorrhage or exudates,  Neck supple, trachea midline, no carotid bruits, no thyroidmegaly Lungs: Clear to auscultation, no rhonchi or wheezes, or rib retractions  Heart: Regular rate and rhythm, no murmurs or gallops Breast:Examined in sitting and supine position were symmetrical in appearance, no palpable masses or tenderness,  no skin retraction, no nipple inversion, no nipple discharge, no skin discoloration, no axillary or supraclavicular lymphadenopathy Abdomen: no palpable masses or tenderness, no rebound or guarding Extremities: no edema or skin discoloration or tenderness  Pelvic: Vulva: Normal             Vagina: No gross lesions or discharge  Cervix: No gross lesions or discharge  Uterus  AV, normal size, shape and consistency, non-tender and mobile  Adnexa  Without masses or tenderness  Anus: Normal   Assessment/Plan:  65 y.o. female for annual exam   1. Well female exam with routine gynecological exam Normal gynecologic exam in menopause.  No indication for Pap test at this time.  Breast exam normal.  Screening mammogram April 2021 was negative.  Colonoscopy 2019.  Health labs  with family physician.  2. Postmenopausal Well on no hormone replacement therapy.  No postmenopausal bleeding.  3. Osteopenia of neck of left femur Osteopenia at the spine with a T score of -1.5.  We will repeat a bone density in April 2023 at 4 years.  Recommend vitamin D supplements, calcium intake of 1.5 g/day total and regular weightbearing physical activities.  4. Class 2 obesity due to excess calories without serious comorbidity with body mass index (BMI) of 35.0 to 35.9 in adult Recommend a lower calorie/carb diet.  Aerobic activities 5 times a week and light  weightlifting every 2 days.  Genia Del MD, 3:22 PM 10/08/2020

## 2020-10-10 ENCOUNTER — Encounter: Payer: Self-pay | Admitting: Obstetrics & Gynecology

## 2021-02-15 ENCOUNTER — Other Ambulatory Visit: Payer: Self-pay | Admitting: Obstetrics & Gynecology

## 2021-02-15 DIAGNOSIS — Z1231 Encounter for screening mammogram for malignant neoplasm of breast: Secondary | ICD-10-CM

## 2021-02-24 ENCOUNTER — Ambulatory Visit: Payer: Federal, State, Local not specified - PPO

## 2021-03-03 ENCOUNTER — Ambulatory Visit (INDEPENDENT_AMBULATORY_CARE_PROVIDER_SITE_OTHER): Payer: Federal, State, Local not specified - PPO

## 2021-03-03 ENCOUNTER — Other Ambulatory Visit: Payer: Self-pay

## 2021-03-03 DIAGNOSIS — Z1231 Encounter for screening mammogram for malignant neoplasm of breast: Secondary | ICD-10-CM

## 2021-10-11 ENCOUNTER — Encounter: Payer: Self-pay | Admitting: Obstetrics & Gynecology

## 2021-10-11 ENCOUNTER — Other Ambulatory Visit (HOSPITAL_COMMUNITY)
Admission: RE | Admit: 2021-10-11 | Discharge: 2021-10-11 | Disposition: A | Discharge status: Home / Self Care | Payer: Federal, State, Local not specified - PPO | Source: Ambulatory Visit | Attending: Obstetrics & Gynecology | Admitting: Obstetrics & Gynecology

## 2021-10-11 ENCOUNTER — Ambulatory Visit (INDEPENDENT_AMBULATORY_CARE_PROVIDER_SITE_OTHER): Payer: Federal, State, Local not specified - PPO | Admitting: Obstetrics & Gynecology

## 2021-10-11 VITALS — BP 118/70 | HR 70 | Resp 16 | Ht 66.25 in | Wt 229.0 lb

## 2021-10-11 DIAGNOSIS — Z01419 Encounter for gynecological examination (general) (routine) without abnormal findings: Secondary | ICD-10-CM

## 2021-10-11 DIAGNOSIS — Z780 Asymptomatic menopausal state: Secondary | ICD-10-CM | POA: Diagnosis not present

## 2021-10-11 DIAGNOSIS — Z6836 Body mass index (BMI) 36.0-36.9, adult: Secondary | ICD-10-CM

## 2021-10-11 DIAGNOSIS — M85852 Other specified disorders of bone density and structure, left thigh: Secondary | ICD-10-CM

## 2021-10-11 NOTE — Progress Notes (Signed)
? ? ?Marissa White 1955-09-08 [id number] ? ? ?History:    66 y.o. 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  ?RP:  Established patient presenting for annual gyn exam  ?  ?HPI: Postmenopause, on no HRT with occasional hot flushes.  Recommend trying phyto-estrogens such as Ashwagandha.  No PMB.  No Pelvic pain.  Abstinent currently. Pap Neg 07/2018.  Pap reflex today. Normal vaginal secretions.  Urine and bowel movements normal currently.  Breasts normal.  Mammo Neg 02/2021.  Body mass index increased to 36.68.  Just started on Ozempic yesterday.  Lower calorie/carb diet.  Increase fitness activities.  Bone Density Osteopenia T-Score -1.5 in 10/2019, will repeat a BD in 10/2021.  Health labs with family physician.  Colonoscopy in 2019.   ? ?Past medical history,surgical history, family history and social history were all reviewed and documented in the EPIC chart. ? ?Gynecologic History ?No LMP recorded. Patient is postmenopausal. ? ?Obstetric History ?OB History  ?Gravida Para Term Preterm AB Living  ?5 1     4 1   ?SAB IAB Ectopic Multiple Live Births  ?3 1        ?  ?# Outcome Date GA Lbr Len/2nd Weight Sex Delivery Anes PTL Lv  ?5 IAB           ?4 SAB           ?3 SAB           ?2 SAB           ?1 Para           ? ? ? ?ROS: A ROS was performed and pertinent positives and negatives are included in the history. ? GENERAL: No fevers or chills. HEENT: No change in vision, no earache, sore throat or sinus congestion. NECK: No pain or stiffness. CARDIOVASCULAR: No chest pain or pressure. No palpitations. PULMONARY: No shortness of breath, cough or wheeze. GASTROINTESTINAL: No abdominal pain, nausea, vomiting or diarrhea, melena or bright red blood per rectum. GENITOURINARY: No urinary frequency, urgency, hesitancy or dysuria. MUSCULOSKELETAL: No joint or muscle pain, no back pain, no recent trauma. DERMATOLOGIC: No rash, no itching, no lesions. ENDOCRINE: No polyuria,  polydipsia, no heat or cold intolerance. No recent change in weight. HEMATOLOGICAL: No anemia or easy bruising or bleeding. NEUROLOGIC: No headache, seizures, numbness, tingling or weakness. PSYCHIATRIC: No depression, no loss of interest in normal activity or change in sleep pattern.  ?  ? ?Exam: ? ? ?BP 118/70   Pulse 70   Resp 16   Ht 5' 6.25" (1.683 m)   Wt 229 lb (103.9 kg)   BMI 36.68 kg/m?  ? ?Body mass index is 36.68 kg/m?. ? ?General appearance : Well developed well nourished female. No acute distress ?HEENT: Eyes: no retinal hemorrhage or exudates,  Neck supple, trachea midline, no carotid bruits, no thyroidmegaly ?Lungs: Clear to auscultation, no rhonchi or wheezes, or rib retractions  ?Heart: Regular rate and rhythm, no murmurs or gallops ?Breast:Examined in sitting and supine position were symmetrical in appearance, no palpable masses or tenderness,  no skin retraction, no nipple inversion, no nipple discharge, no skin discoloration, no axillary or supraclavicular lymphadenopathy ?Abdomen: no palpable masses or tenderness, no rebound or guarding ?Extremities: no edema or skin discoloration or tenderness ? ?Pelvic: Vulva: Normal ?            Vagina: No gross lesions or discharge ? Cervix: No gross lesions or discharge.  Pap reflex done. ? Uterus  AV, normal size, shape and consistency, non-tender and mobile ? Adnexa  Without masses or tenderness ? Anus: Normal ? ? ?Assessment/Plan:  66 y.o. female for annual exam  ? ?1. Encounter for routine gynecological examination with Papanicolaou smear of cervix ?Postmenopause, on no HRT with occasional hot flushes.  Recommend trying phyto-estrogens such as Ashwagandha.  No PMB.  No Pelvic pain.  Abstinent currently. Pap Neg 07/2018.  Pap reflex today. Normal vaginal secretions.  Urine and bowel movements normal currently.  Breasts normal.  Mammo Neg 02/2021.  Body mass index increased to 36.68.  Just started on Ozempic yesterday.  Lower calorie/carb diet.   Increase fitness activities.  Bone Density Osteopenia T-Score -1.5 in 10/2019, will repeat a BD in 10/2021.  Health labs with family physician.  Colonoscopy in 2019.   ?- Cytology - PAP( [place]) ? ?2. Postmenopausal ?Occasional hot flushes. Postmenopausal x many years on no systemic HRT.  Risk of stroke would be too high to start on HRT at this point. Rather recommend Ashwagandha supplement. ?- DG Bone Density; Future ? ?3. Osteopenia of neck of left femur ?Osteopenia with T-Score at -1.5 in 10/2019.  Time to repeat a BD, will schedule in [facility]).  Vit D, Ca++ 1.5 g/d total, increase weight bearing physical activities. ?- DG Bone Density; Future ? ?4. Class 2 severe obesity due to excess calories with serious comorbidity and body mass index (BMI) of 36.0 to 36.9 in adult [facility]) ?Just started on Ozempic.  Low calorie/carb diet.  Increase fitness activities. ? ?Other orders ?- amoxicillin-clavulanate (AUGMENTIN) 875-125 MG tablet; Take 1 tablet by mouth 2 (two) times daily. ?- BREZTRI AEROSPHERE 160-9-4.8 MCG/ACT AERO; Inhale into the lungs. ?- ergocalciferol (VITAMIN D2) 1.25 MG (50000 UT) capsule; Take by mouth. (Patient not taking: Reported on 10/11/2021) ?- metFORMIN (GLUCOPHAGE) 500 MG tablet; Take 500 mg by mouth 2 (two) times daily. ?- predniSONE (DELTASONE) 10 MG tablet; Take 4 tabs po x 2 days, 3 tabs po x 2 days, 2 tabs po x 2 days, and 1 tab po x 2 days. ?- OZEMPIC, 0.25 OR 0.5 MG/DOSE, 2 MG/3ML SOPN; Inject into the skin. ?- fexofenadine (ALLEGRA) 180 MG tablet; Take 180 mg by mouth daily. ?- montelukast (SINGULAIR) 10 MG tablet; Take 10 mg by mouth at bedtime.  ? ?Genia Del MD, 3:43 PM 10/11/2021 ? ?  ?

## 2021-10-12 ENCOUNTER — Other Ambulatory Visit: Payer: Federal, State, Local not specified - PPO

## 2021-10-13 LAB — CYTOLOGY - PAP: Diagnosis: NEGATIVE

## 2021-11-02 ENCOUNTER — Ambulatory Visit (INDEPENDENT_AMBULATORY_CARE_PROVIDER_SITE_OTHER): Payer: Federal, State, Local not specified - PPO

## 2021-11-02 DIAGNOSIS — Z780 Asymptomatic menopausal state: Secondary | ICD-10-CM | POA: Diagnosis not present

## 2021-11-02 DIAGNOSIS — M85852 Other specified disorders of bone density and structure, left thigh: Secondary | ICD-10-CM

## 2022-10-17 ENCOUNTER — Ambulatory Visit: Payer: Federal, State, Local not specified - PPO | Admitting: Obstetrics & Gynecology

## 2023-01-03 ENCOUNTER — Ambulatory Visit: Payer: Federal, State, Local not specified - PPO | Admitting: Obstetrics & Gynecology

## 2023-01-24 ENCOUNTER — Ambulatory Visit: Payer: Federal, State, Local not specified - PPO | Admitting: Obstetrics & Gynecology

## 2023-03-13 ENCOUNTER — Ambulatory Visit: Payer: Federal, State, Local not specified - PPO | Admitting: Obstetrics and Gynecology

## 2023-07-16 IMAGING — MG MM DIGITAL SCREENING BILAT W/ TOMO AND CAD
8 series · 8 of 24 positions shown · non-contrast
Comparison: Previous exam(s).

CLINICAL DATA: Screening.

EXAM:
DIGITAL SCREENING BILATERAL MAMMOGRAM WITH TOMOSYNTHESIS AND CAD
TECHNIQUE: Bilateral screening digital craniocaudal and mediolateral oblique
mammograms were obtained. Bilateral screening digital breast
tomosynthesis was performed. The images were evaluated with
computer-aided detection.

[R MLO synth-2D]
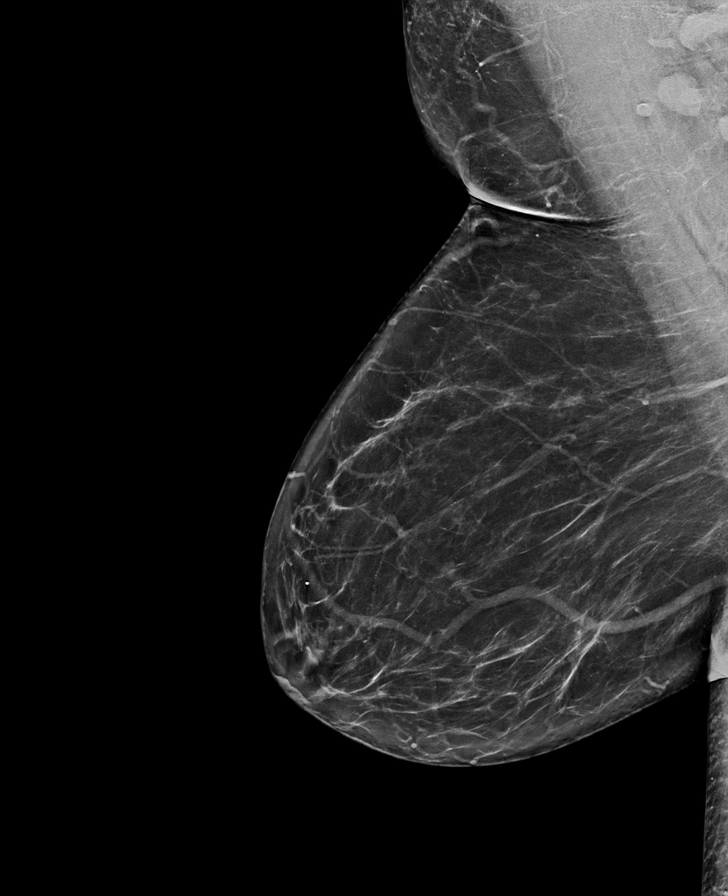

[L MLO synth-2D]
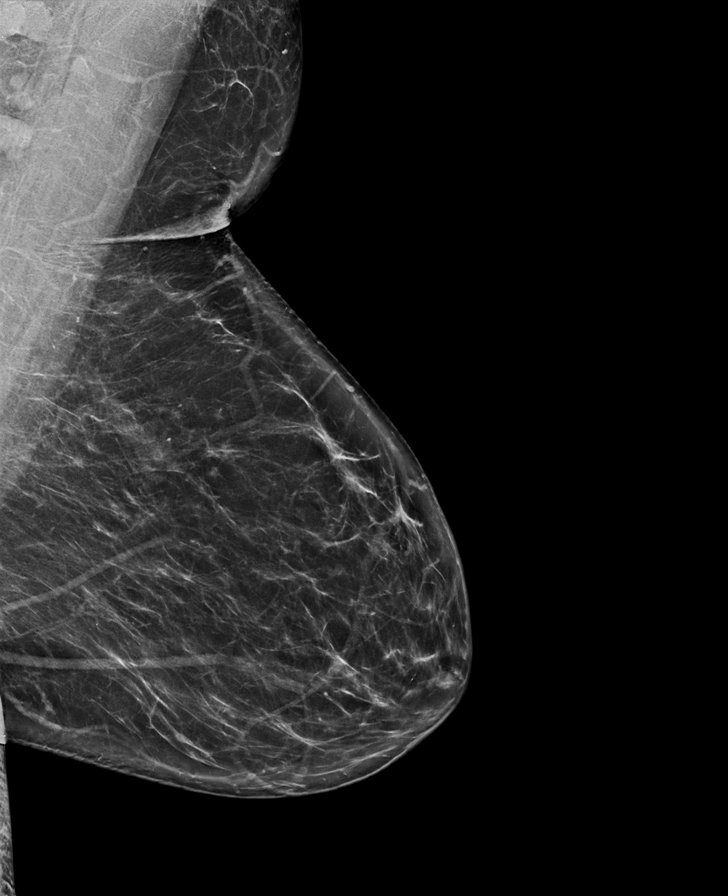

[R CC synth-2D]
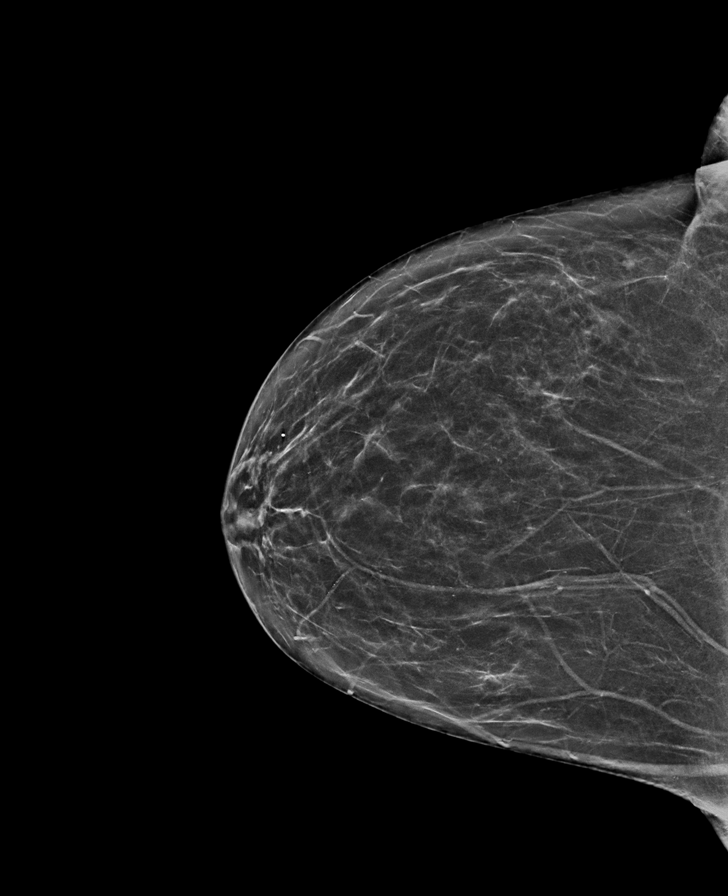

[L CC synth-2D]
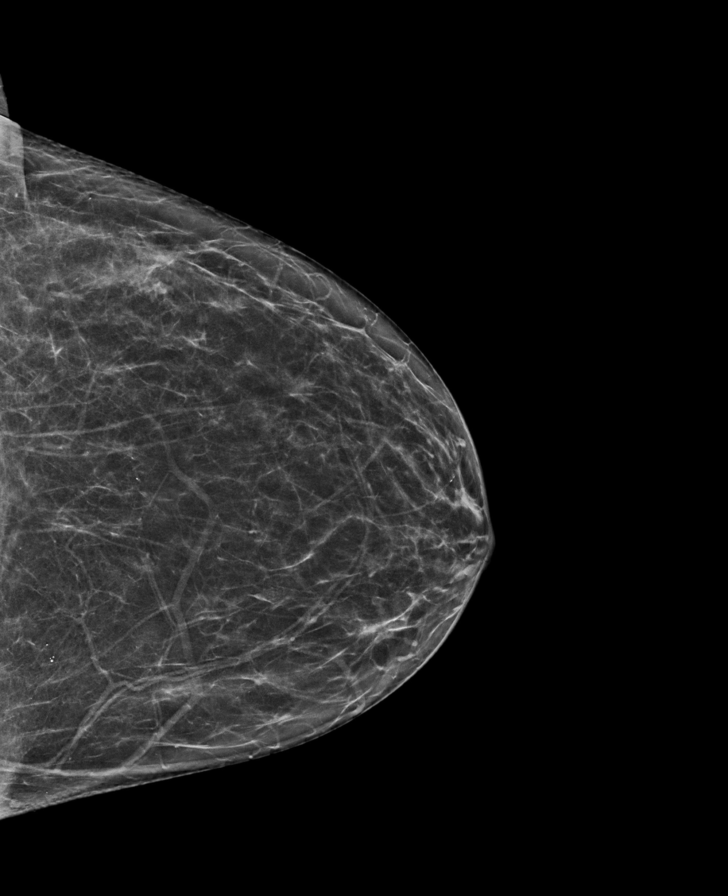

[R MLO tomo · tomo slice 41/80.0]
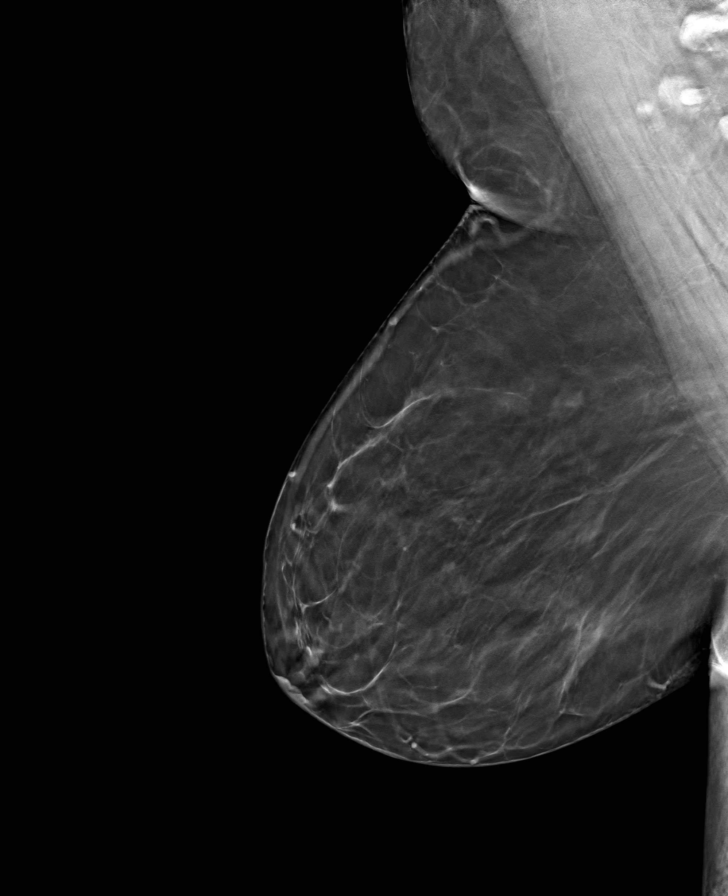

[R CC tomo · tomo slice 33/64.0]
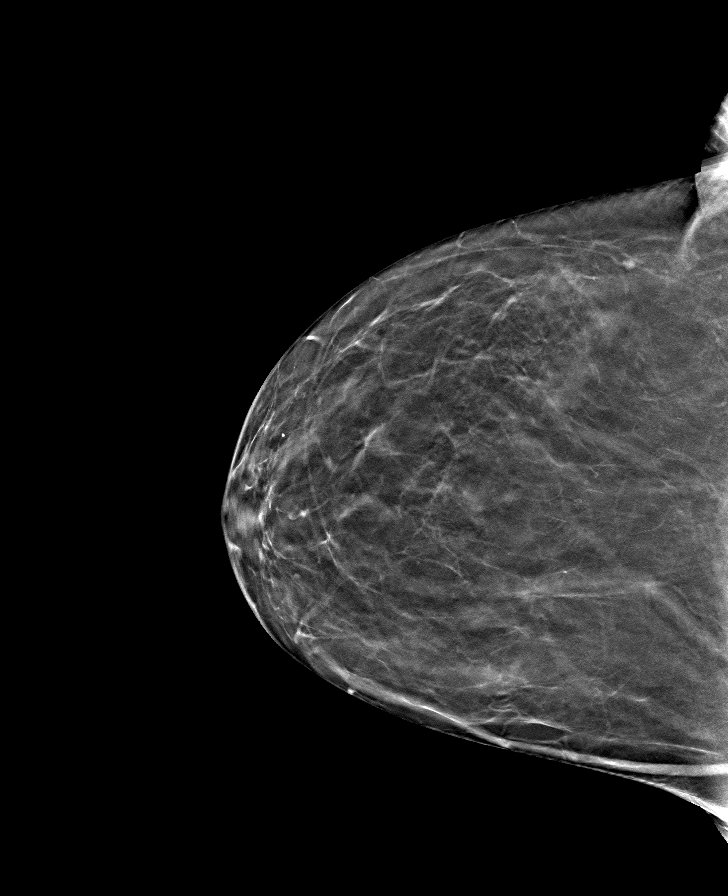

[L CC tomo · tomo slice 33/66.0]
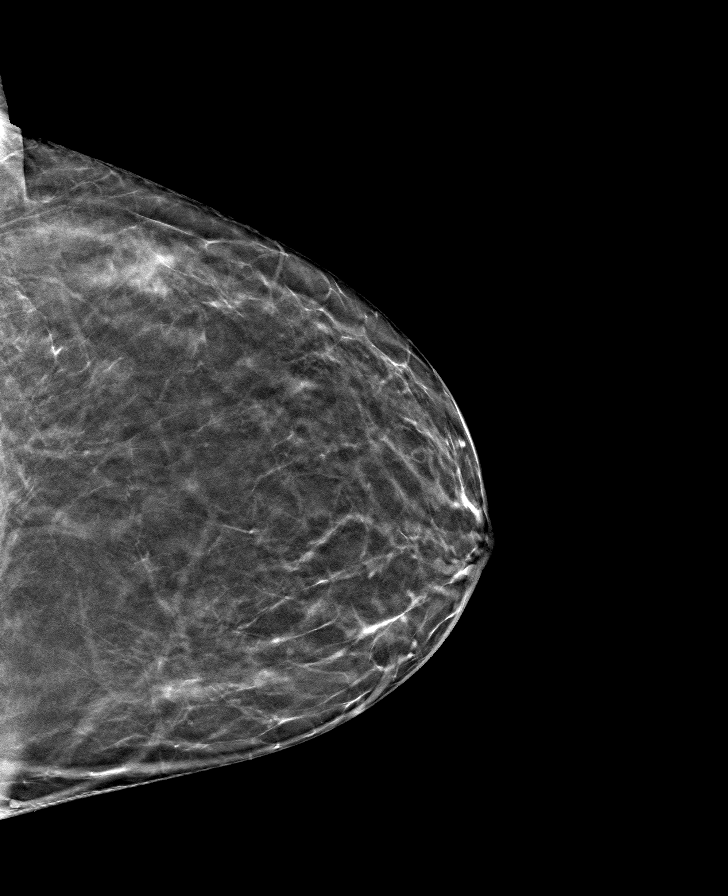

[L MLO tomo · tomo slice 39/77.0]
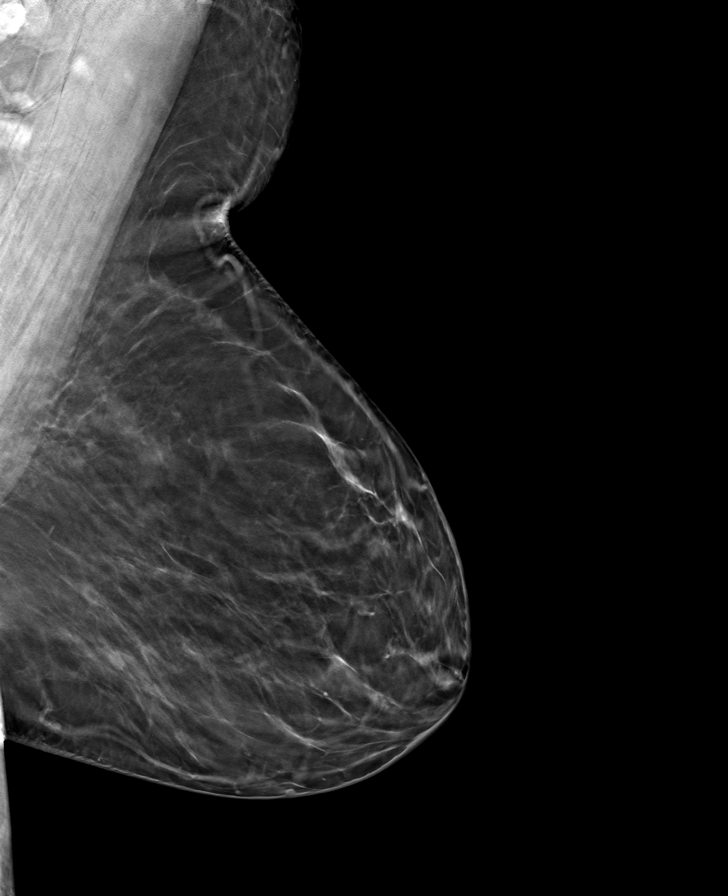

[8 of 24 positions shown; findings below may reference images not displayed]

ACR Breast Density Category b: There are scattered areas of
fibroglandular density.
FINDINGS: There are no findings suspicious for malignancy.
IMPRESSION: No mammographic evidence of malignancy. A result letter of this
screening mammogram will be mailed directly to the patient.

RECOMMENDATION:
Screening mammogram in one year. (Code:51-O-LD2)

BI-RADS CATEGORY  1: Negative.
